# Patient Record
Sex: Female | Born: 1935 | Race: White | Hispanic: No | Marital: Married | Smoking: Never smoker
Health system: Southern US, Community
[De-identification: ages and names within clinical notes are randomized; demographics above are authoritative.]

## PROBLEM LIST (undated history)

## (undated) DIAGNOSIS — I10 Essential (primary) hypertension: Secondary | ICD-10-CM

## (undated) DIAGNOSIS — I2510 Atherosclerotic heart disease of native coronary artery without angina pectoris: Secondary | ICD-10-CM

## (undated) DIAGNOSIS — I509 Heart failure, unspecified: Secondary | ICD-10-CM

## (undated) DIAGNOSIS — I739 Peripheral vascular disease, unspecified: Secondary | ICD-10-CM

## (undated) DIAGNOSIS — E785 Hyperlipidemia, unspecified: Secondary | ICD-10-CM

## (undated) DIAGNOSIS — M810 Age-related osteoporosis without current pathological fracture: Secondary | ICD-10-CM

## (undated) DIAGNOSIS — I4891 Unspecified atrial fibrillation: Secondary | ICD-10-CM

## (undated) HISTORY — PX: CHOLECYSTECTOMY: SHX55

## (undated) HISTORY — PX: CORONARY ANGIOPLASTY WITH STENT PLACEMENT: SHX49

---

## 2015-08-03 ENCOUNTER — Emergency Department (HOSPITAL_COMMUNITY): Payer: Medicare Other

## 2015-08-03 ENCOUNTER — Emergency Department (HOSPITAL_COMMUNITY)
Admission: EM | Admit: 2015-08-03 | Discharge: 2015-08-03 | Disposition: A | Discharge status: Home / Self Care | Payer: Medicare Other | Attending: Emergency Medicine | Admitting: Emergency Medicine

## 2015-08-03 ENCOUNTER — Encounter (HOSPITAL_COMMUNITY): Payer: Self-pay | Admitting: Emergency Medicine

## 2015-08-03 DIAGNOSIS — Z9861 Coronary angioplasty status: Secondary | ICD-10-CM | POA: Diagnosis not present

## 2015-08-03 DIAGNOSIS — Y9289 Other specified places as the place of occurrence of the external cause: Secondary | ICD-10-CM | POA: Insufficient documentation

## 2015-08-03 DIAGNOSIS — Z7982 Long term (current) use of aspirin: Secondary | ICD-10-CM | POA: Insufficient documentation

## 2015-08-03 DIAGNOSIS — I2510 Atherosclerotic heart disease of native coronary artery without angina pectoris: Secondary | ICD-10-CM | POA: Diagnosis not present

## 2015-08-03 DIAGNOSIS — I509 Heart failure, unspecified: Secondary | ICD-10-CM | POA: Diagnosis not present

## 2015-08-03 DIAGNOSIS — W01118A Fall on same level from slipping, tripping and stumbling with subsequent striking against other sharp object, initial encounter: Secondary | ICD-10-CM | POA: Insufficient documentation

## 2015-08-03 DIAGNOSIS — Z91040 Latex allergy status: Secondary | ICD-10-CM | POA: Insufficient documentation

## 2015-08-03 DIAGNOSIS — S0003XA Contusion of scalp, initial encounter: Secondary | ICD-10-CM

## 2015-08-03 DIAGNOSIS — E785 Hyperlipidemia, unspecified: Secondary | ICD-10-CM | POA: Insufficient documentation

## 2015-08-03 DIAGNOSIS — W01119A Fall on same level from slipping, tripping and stumbling with subsequent striking against unspecified sharp object, initial encounter: Secondary | ICD-10-CM

## 2015-08-03 DIAGNOSIS — Y9389 Activity, other specified: Secondary | ICD-10-CM | POA: Insufficient documentation

## 2015-08-03 DIAGNOSIS — Y998 Other external cause status: Secondary | ICD-10-CM | POA: Insufficient documentation

## 2015-08-03 DIAGNOSIS — Z7902 Long term (current) use of antithrombotics/antiplatelets: Secondary | ICD-10-CM | POA: Diagnosis not present

## 2015-08-03 DIAGNOSIS — I10 Essential (primary) hypertension: Secondary | ICD-10-CM | POA: Insufficient documentation

## 2015-08-03 DIAGNOSIS — M810 Age-related osteoporosis without current pathological fracture: Secondary | ICD-10-CM | POA: Insufficient documentation

## 2015-08-03 DIAGNOSIS — I4891 Unspecified atrial fibrillation: Secondary | ICD-10-CM | POA: Insufficient documentation

## 2015-08-03 DIAGNOSIS — S0990XA Unspecified injury of head, initial encounter: Secondary | ICD-10-CM | POA: Diagnosis present

## 2015-08-03 HISTORY — DX: Heart failure, unspecified: I50.9

## 2015-08-03 HISTORY — DX: Essential (primary) hypertension: I10

## 2015-08-03 HISTORY — DX: Age-related osteoporosis without current pathological fracture: M81.0

## 2015-08-03 HISTORY — DX: Peripheral vascular disease, unspecified: I73.9

## 2015-08-03 HISTORY — DX: Hyperlipidemia, unspecified: E78.5

## 2015-08-03 HISTORY — DX: Unspecified atrial fibrillation: I48.91

## 2015-08-03 HISTORY — DX: Atherosclerotic heart disease of native coronary artery without angina pectoris: I25.10

## 2015-08-03 MED ORDER — HYDROCODONE-ACETAMINOPHEN 5-325 MG PO TABS: 1.0000 | ORAL_TABLET | ORAL | Status: AC | PRN

## 2015-08-03 MED ORDER — HYDROCODONE-ACETAMINOPHEN 5-325 MG PO TABS
1.0000 | ORAL_TABLET | Freq: Once | ORAL | Status: DC
  Filled 2015-08-03: qty 1

## 2015-08-03 MED ORDER — ACETAMINOPHEN 325 MG PO TABS
650.0000 mg | ORAL_TABLET | Freq: Once | ORAL | Status: AC
  Administered 2015-08-03: 650 mg via ORAL
  Filled 2015-08-03: qty 2

## 2015-08-03 NOTE — ED Notes (Signed)
Patient transported to CT 

## 2015-08-03 NOTE — Discharge Instructions (Signed)
Facial or Scalp Contusion A facial or scalp contusion is a deep bruise on the face or head. Injuries to the face and head generally cause a lot of swelling, especially around the eyes. Contusions are the result of an injury that caused bleeding under the skin. The contusion may turn blue, purple, or yellow. Minor injuries will give you a painless contusion, but more severe contusions may stay painful and swollen for a few weeks.  CAUSES  A facial or scalp contusion is caused by a blunt injury or trauma to the face or head area.  SIGNS AND SYMPTOMS   Swelling of the injured area.   Discoloration of the injured area.   Tenderness, soreness, or pain in the injured area.  DIAGNOSIS  The diagnosis can be made by taking a medical history and doing a physical exam. An X-ray exam, CT scan, or MRI may be needed to determine if there are any associated injuries, such as broken bones (fractures). TREATMENT  Often, the best treatment for a facial or scalp contusion is applying cold compresses to the injured area. Over-the-counter medicines may also be recommended for pain control.  HOME CARE INSTRUCTIONS   Only take over-the-counter or prescription medicines as directed by your health care provider.   Apply ice to the injured area.   Put ice in a plastic bag.   Place a towel between your skin and the bag.   Leave the ice on for 20 minutes, 2-3 times a day.  SEEK MEDICAL CARE IF:  You have bite problems.   You have pain with chewing.   You are concerned about facial defects. SEEK IMMEDIATE MEDICAL CARE IF:  You have severe pain or a headache that is not relieved by medicine.   You have unusual sleepiness, confusion, or personality changes.   You throw up (vomit).   You have a persistent nosebleed.   You have double vision or blurred vision.   You have fluid drainage from your nose or ear.   You have difficulty walking or using your arms or legs.  MAKE SURE YOU:    Understand these instructions.  Will watch your condition.  Will get help right away if you are not doing well or get worse.      Please follow up with your PCP when you get home  Continue taking your Plavix as this medication is important for your heart  Apply ice for swelling as needed

## 2015-08-03 NOTE — ED Notes (Signed)
Pt statesshe understands instructions. 

## 2015-08-03 NOTE — ED Notes (Signed)
Pt ambulates in hallway with minimal assist. 

## 2015-08-03 NOTE — ED Notes (Signed)
Norco held after pt had lower BP.

## 2015-08-03 NOTE — ED Notes (Signed)
Pt tripped and fell at 8:15 this morning and hit her head on the table. Pt denies LOC. Pt advises she takes plavix. Pt has large hematoma above R eye. Pt has ice pack applied.

## 2015-08-03 NOTE — ED Provider Notes (Signed)
CSN: [id number]     Arrival date & time 08/03/15  [id number] History   First MD Initiated Contact with Patient 08/03/15 1050     Chief Complaint  Patient presents with  . Fall    HPI Comments: 82 year old female who presents with a head injury after a mechanical fall several hours ago. Her and her husband are from from [place] and have been visiting their son. She states she was walking around the house when she tripped and hit her right forehead on the angle of a side table. Pain is currently 6/10. She is currently on Plavix and has taken it today. She normally walks without assistance. Denies LOC, dizziness, chest pain, SOB, abdominal pain, nausea, vomiting, seizure, other acute injury.   The history is provided by the patient.    Past Medical History  Diagnosis Date  . Hyperlipidemia   . CHF (congestive heart failure) (HCC)   . Atrial fibrillation (HCC)   . Osteoporosis   . Peripheral artery disease (HCC)   . Coronary artery disease   . Hypertension    Past Surgical History  Procedure Laterality Date  . Coronary angioplasty with stent placement    . Cholecystectomy     No family history on file. Social History  Substance Use Topics  . Smoking status: Never Smoker   . Smokeless tobacco: None  . Alcohol Use: No   OB History    No data available     Review of Systems  Respiratory: Negative for shortness of breath.   Cardiovascular: Negative for chest pain.  Gastrointestinal: Negative for abdominal pain.  Musculoskeletal: Negative for gait problem.  Skin: Positive for wound.  Neurological: Positive for headaches. Negative for dizziness, tremors, seizures, syncope, speech difficulty, weakness, light-headedness and numbness.    Allergies  Latex  Home Medications   Prior to Admission medications   Medication Sig Start Date End Date Taking? Authorizing Provider  aspirin 325 MG tablet Take 325 mg by mouth daily.   Yes Historical Provider, MD  Calcium Carbonate-Vitamin D  (CALCIUM 500+D HIGH POTENCY PO) Take 1 tablet by mouth daily.   Yes Historical Provider, MD  clopidogrel (PLAVIX) 75 MG tablet Take 75 mg by mouth daily.   Yes Historical Provider, MD  escitalopram (LEXAPRO) 10 MG tablet Take 10 mg by mouth daily.   Yes Historical Provider, MD  furosemide (LASIX) 20 MG tablet Take 20 mg by mouth daily.   Yes Historical Provider, MD  levothyroxine (SYNTHROID, LEVOTHROID) 50 MCG tablet Take 50 mcg by mouth daily before breakfast.   Yes Historical Provider, MD  metoprolol succinate (TOPROL-XL) 25 MG 24 hr tablet Take 25 mg by mouth daily.   Yes Historical Provider, MD  nitroGLYCERIN (NITROSTAT) 0.4 MG SL tablet Place 0.4 mg under the tongue every 5 (five) minutes as needed for chest pain.   Yes Historical Provider, MD  Omega-3 Fatty Acids (FISH OIL) 1000 MG CAPS Take 1,000 mg by mouth 2 (two) times daily.   Yes Historical Provider, MD  ranitidine (ZANTAC) 75 MG tablet Take 75 mg by mouth daily as needed for heartburn.   Yes Historical Provider, MD  rosuvastatin (CRESTOR) 40 MG tablet Take 40 mg by mouth daily.   Yes Historical Provider, MD  thiamine (VITAMIN B-1) 100 MG tablet Take 100 mg by mouth daily.   Yes Historical Provider, MD  vitamin C (ASCORBIC ACID) 500 MG tablet Take 500 mg by mouth daily.   Yes Historical Provider, MD  vitamin E 400  UNIT capsule Take 400 Units by mouth daily.   Yes Historical Provider, MD   BP 108/54 mmHg  Pulse 56  Temp(Src) 97.5 F (36.4 C) (Oral)  Resp 16  Ht 4' 10.5" (1.486 m)  Wt 90.719 kg  BMI 41.08 kg/m2  SpO2 96%   Physical Exam  Constitutional: She is oriented to person, place, and time. She appears well-developed and well-nourished. No distress.  HENT:  Head: Normocephalic. Head is without raccoon's eyes and without Battle's sign.  Right Ear: No hemotympanum.  Left Ear: No hemotympanum.  Nose: No nasal deformity or nasal septal hematoma. No epistaxis.  Hematoma over right forehead. No open wound.  Eyes:  Conjunctivae are normal. Pupils are equal, round, and reactive to light. Right eye exhibits no discharge. Left eye exhibits no discharge. No scleral icterus.  Neck: Normal range of motion. No spinous process tenderness present. No tracheal deviation present.  Cardiovascular: Normal rate, regular rhythm and intact distal pulses.  Exam reveals no gallop and no friction rub.   No murmur heard. Pulmonary/Chest: Effort normal and breath sounds normal. No respiratory distress. She has no wheezes. She has no rales. She exhibits no tenderness.  Abdominal: Soft. There is no tenderness.  Musculoskeletal:  Back - No point tenderness, step offs, deformity Pelvis - No pain or deformity Upper Extremities - No deformities, swelling, bleeding, discoloration, obvious fractures.  Lower Extremities - No deformities, swelling, bleeding, discoloration, obvious fractures.   Neurological: She is alert and oriented to person, place, and time.  Mental Status:  Alert, oriented, thought content appropriate, able to give a coherent history. Speech fluent without evidence of aphasia. Able to follow 2 step commands without difficulty.  Cranial Nerves:  II:  Peripheral visual fields grossly normal, pupils equal, round, reactive to light III,IV, VI: ptosis not present, extra-ocular motions intact bilaterally  V,VII: smile symmetric, facial light touch sensation equal VIII: hearing grossly normal to voice  X: uvula elevates symmetrically  XI: bilateral shoulder shrug symmetric and strong XII: midline tongue extension without fassiculations Motor:  Normal tone. 5/5 in upper and lower extremities bilaterally including strong and equal grip strength and dorsiflexion/plantar flexion Sensory: Pinprick and light touch normal in all extremities.  Cerebellar: normal finger-to-nose with bilateral upper extremities     Skin: Skin is warm and dry.  Psychiatric: She has a normal mood and affect.    ED Course  Procedures  (including critical care time) Labs Review Labs Reviewed - No data to display  Imaging Review Ct Head Wo Contrast  08/03/2015  CLINICAL DATA:  Tripped and fell, hit head on table. Large hematoma above right eye. On blood thinners. EXAM: CT HEAD WITHOUT CONTRAST TECHNIQUE: Contiguous axial images were obtained from the base of the skull through the vertex without intravenous contrast. COMPARISON:  None. FINDINGS: Large soft tissue hematoma overlying the right frontal region. No underlying bony abnormality. Mild cerebral atrophy. No acute intracranial abnormality. Specifically, no hemorrhage, hydrocephalus, mass lesion, acute infarction, or significant intracranial injury. No acute calvarial abnormality. Visualized paranasal sinuses and mastoids clear. Orbital soft tissues unremarkable. IMPRESSION: Right frontal scalp hematoma.  No acute intracranial abnormality. Electronically Signed   By: Charlett Nose M.D.   On: 08/03/2015 11:43   Ct Cervical Spine Wo Contrast  08/03/2015  CLINICAL DATA:  Tripped and fell, hit head on table. EXAM: CT CERVICAL SPINE WITHOUT CONTRAST TECHNIQUE: Multidetector CT imaging of the cervical spine was performed without intravenous contrast. Multiplanar CT image reconstructions were also generated. COMPARISON:  None. FINDINGS:  Diffuse degenerative disc disease and mild degenerative facet disease bilaterally. Normal alignment. No fracture. Prevertebral soft tissues are normal. Tortuosity of the carotid arteries which are midline. Calcifications in the carotid bulbs bilaterally. IMPRESSION: Degenerative changes in the cervical spine. No acute bony abnormality. Electronically Signed   By: Charlett Nose M.D.   On: 08/03/2015 13:04   I have personally reviewed and evaluated these images and lab results as part of my medical decision-making.   EKG Interpretation None      MDM   Final diagnoses:  Fall against sharp object, initial encounter  Scalp hematoma, initial encounter    82 year old female who presents with a head injury s/p mechanical fall.  She denies LOC, severe HA, vomiting, seizures. She is on Plavix. Advised to not skip a dose as this medicine is important for her heart. Her PE was reassuring in that there were no neuro deficits, signs of skull fracture (Battle sign, Raccoon eyes, nasal CSF leak), no hemotympanum, or septal hematoma. Secondary survey revealed no distracting injury.   CT of head showed large hematoma however no bony abnormality or evidence of brain bleed. CT of neck was negative for acute fracture.   She was able to walk in the hall with minimal assistance. Her BP was on the lower side here however when checked manually it was 100s/60s which is her baseline. Recommended Tylenol or Norco for pain since she is on Plavix. Patient advised to follow up with her PCP. Also advised to delay her trip back to [place] for a couple days to see how she feels. Patient and family informed of clinical course, understand medical decision-making process, and agree with plan.      Mary Born, PA-C 08/04/15 1156  Arby Barrette, MD 08/20/15 0730

## 2015-08-03 NOTE — ED Notes (Signed)
Pt wheeled back to room from waiting room. 

## 2017-10-11 IMAGING — CT CT HEAD W/O CM
2 series · 15 of 30 positions shown, 17 images · non-contrast
Comparison: None.

CLINICAL DATA: Tripped and fell, hit head on table. Large hematoma
above right eye. On blood thinners.

EXAM:
CT HEAD WITHOUT CONTRAST
TECHNIQUE: Contiguous axial images were obtained from the base of the skull
through the vertex without intravenous contrast.

[Series 2: head without · axial · non-contrast · 0.41mm/px · z∈[-210,-95]mm · 7 of 31 slices shown, 9 images]
[im 4/31  brain]
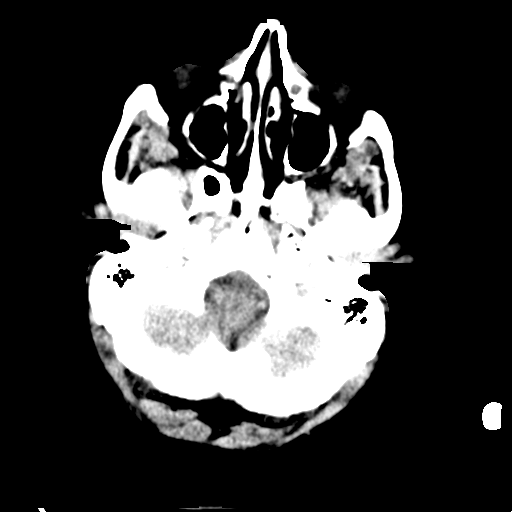
[im 4/31  bone]
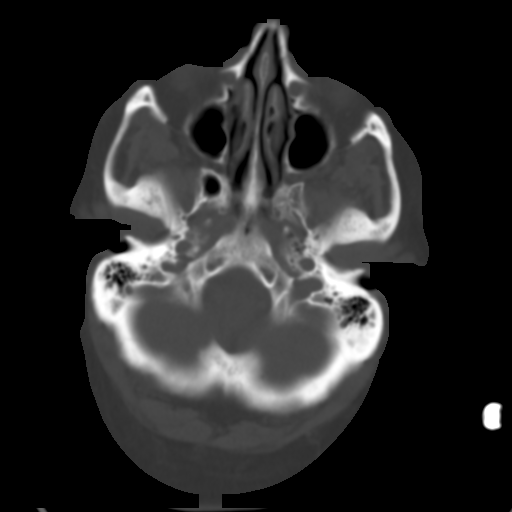
[im 8/31  brain]
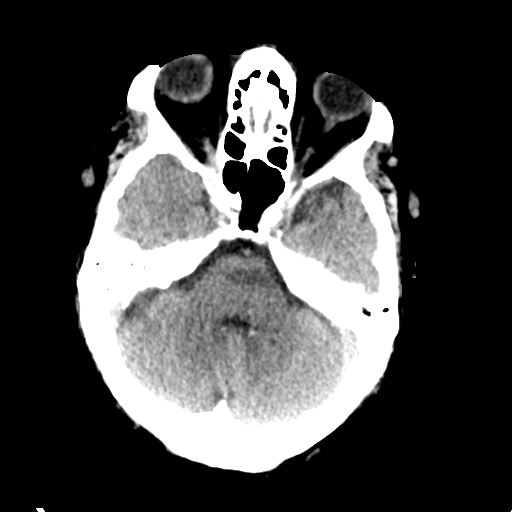
[im 12/31  brain]
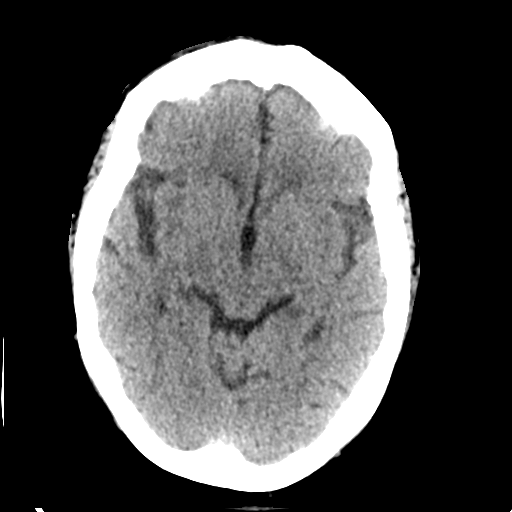
[im 16/31  brain]
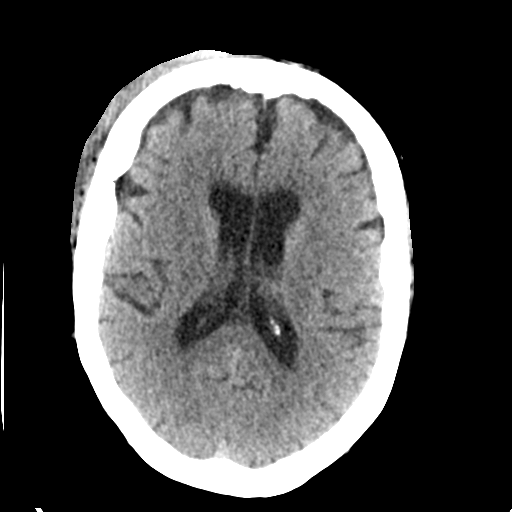
[im 19/31  brain]
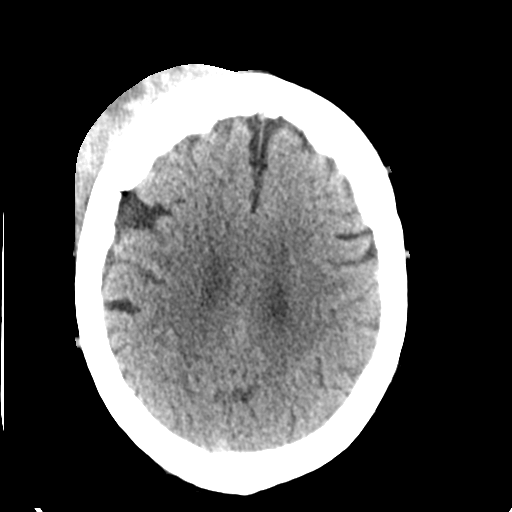
[im 19/31  bone]
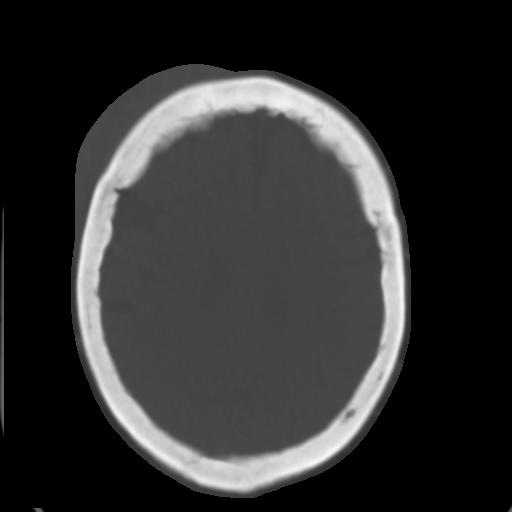
[im 23/31  brain]
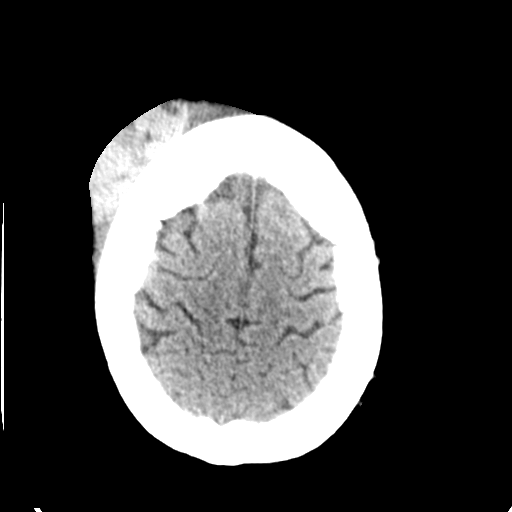
[im 27/31  brain]
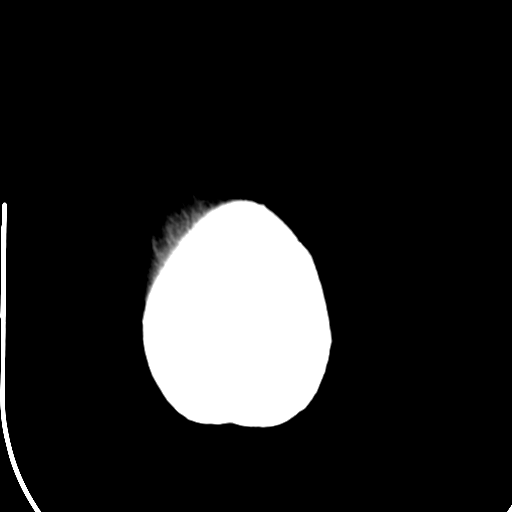

[Series 3: head bone · axial · 0.41mm/px · z∈[-211,-91]mm · 8 of 76 slices shown]
[im 8/76  bone]
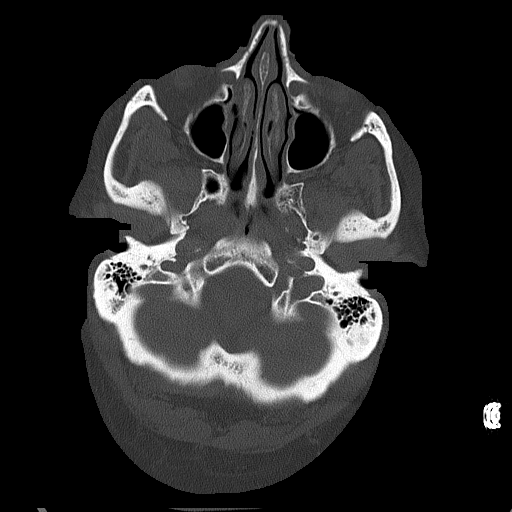
[im 16/76  bone]
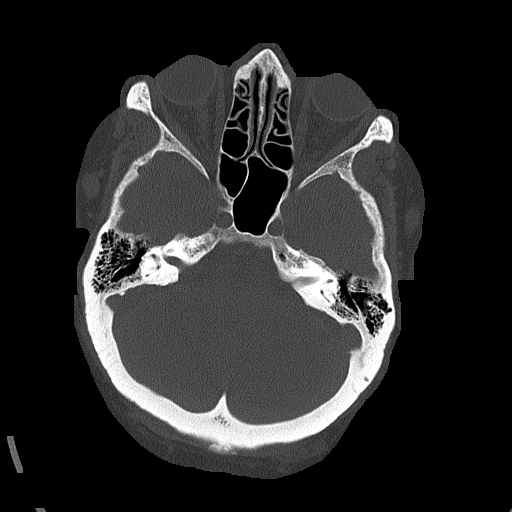
[im 23/76  bone]
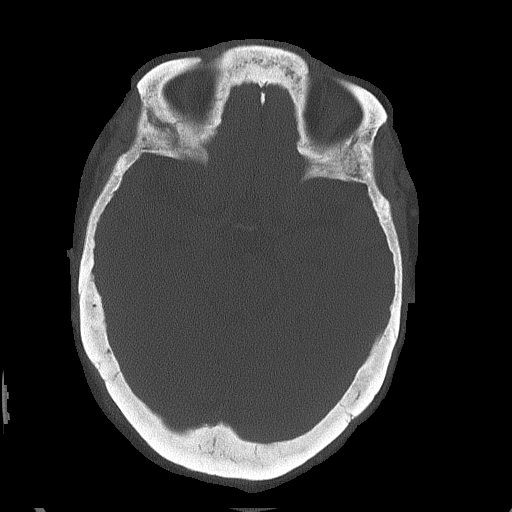
[im 34/76  bone]
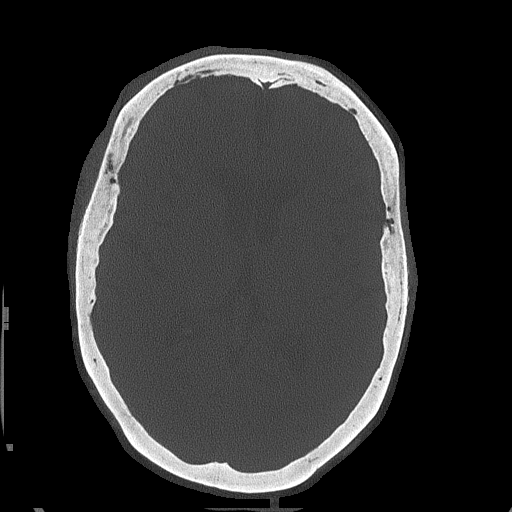
[im 42/76  bone]
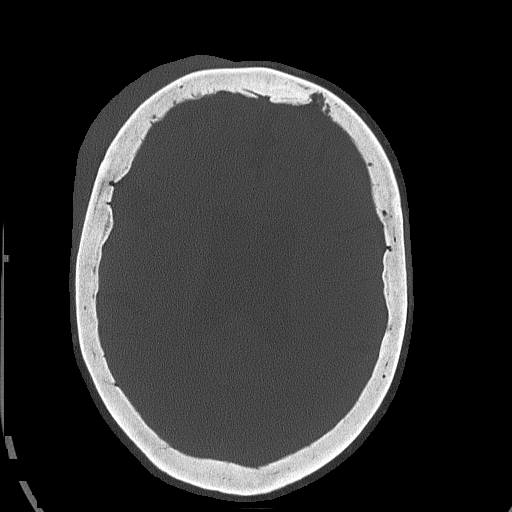
[im 53/76  bone]
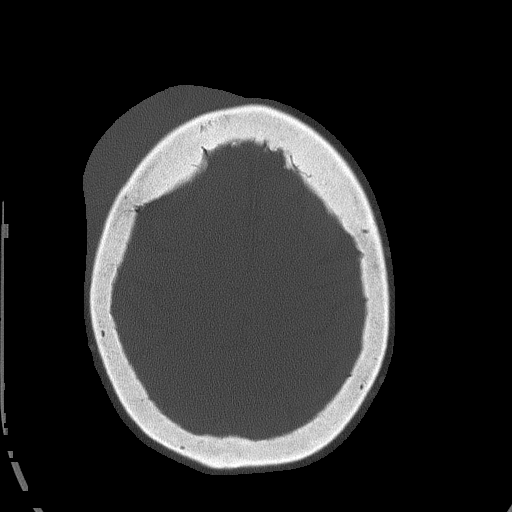
[im 61/76  bone]
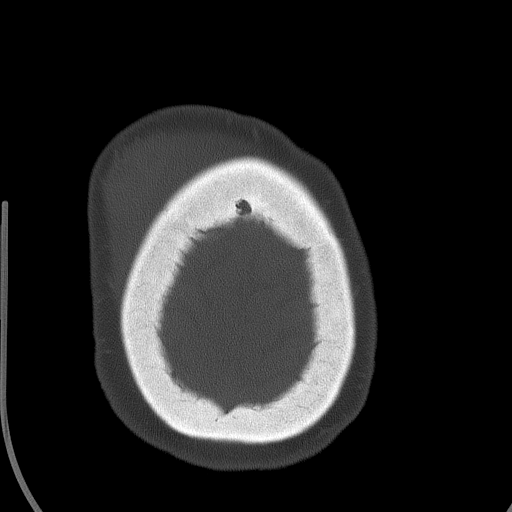
[im 68/76  bone]
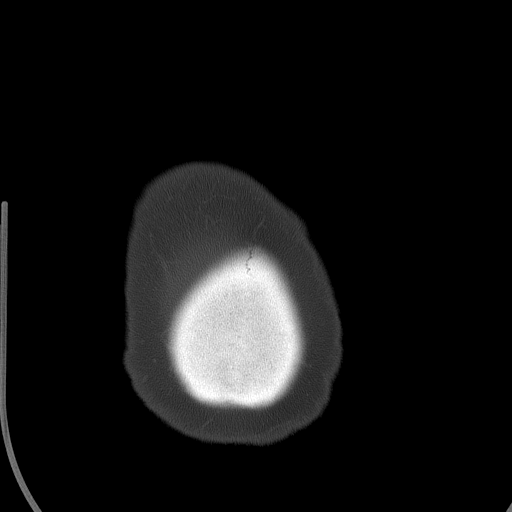

[15 of 30 positions shown; findings below may reference images not displayed]

FINDINGS: Large soft tissue hematoma overlying the right frontal region. No
underlying bony abnormality. Mild cerebral atrophy. No acute
intracranial abnormality. Specifically, no hemorrhage,
hydrocephalus, mass lesion, acute infarction, or significant
intracranial injury. No acute calvarial abnormality. Visualized
paranasal sinuses and mastoids clear. Orbital soft tissues
unremarkable.
IMPRESSION: Right frontal scalp hematoma.  No acute intracranial abnormality.

## 2017-10-11 IMAGING — CT CT CERVICAL SPINE W/O CM
3 of 4 series · 13 of 33 positions shown, 16 images · non-contrast
Comparison: None.

CLINICAL DATA: Tripped and fell, hit head on table.

EXAM:
CT CERVICAL SPINE WITHOUT CONTRAST
TECHNIQUE: Multidetector CT imaging of the cervical spine was performed without
intravenous contrast. Multiplanar CT image reconstructions were also
generated.

[Series 3: c_spine 2.0 st · axial · 0.27mm/px · z∈[-313,-203]mm · 5 of 83 slices shown, 7 images]
[im 14/83  soft-tissue]
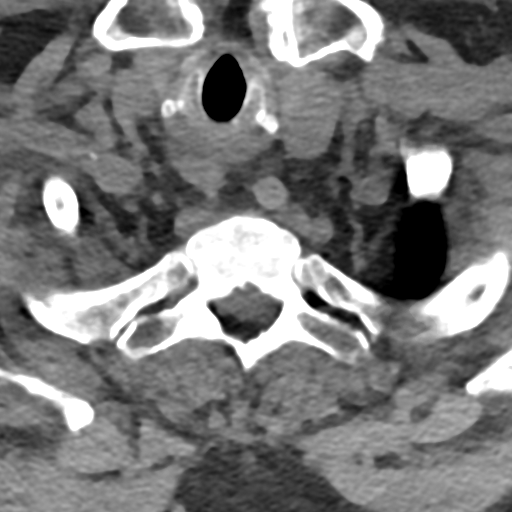
[im 14/83  bone]
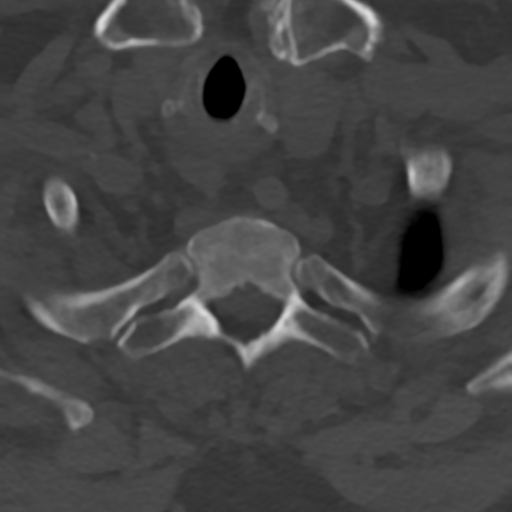
[im 28/83  bone]
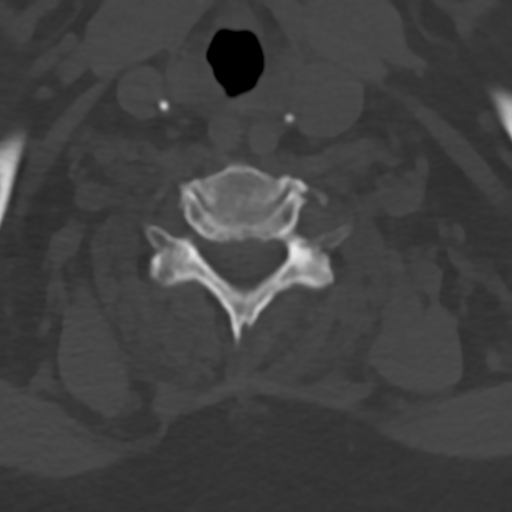
[im 42/83  bone]
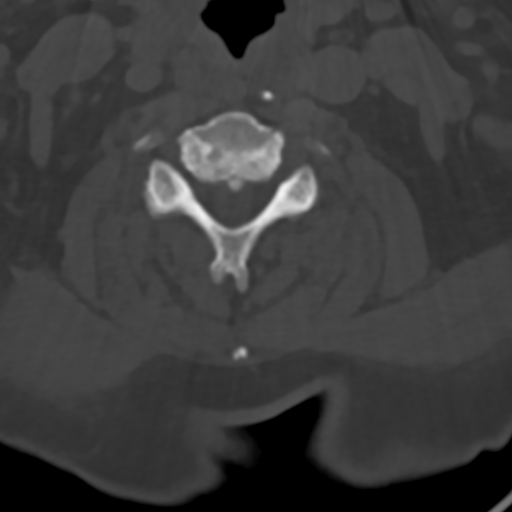
[im 55/83  bone]
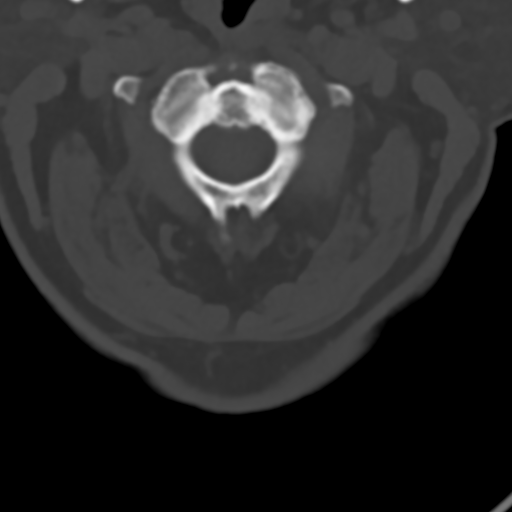
[im 69/83  soft-tissue]
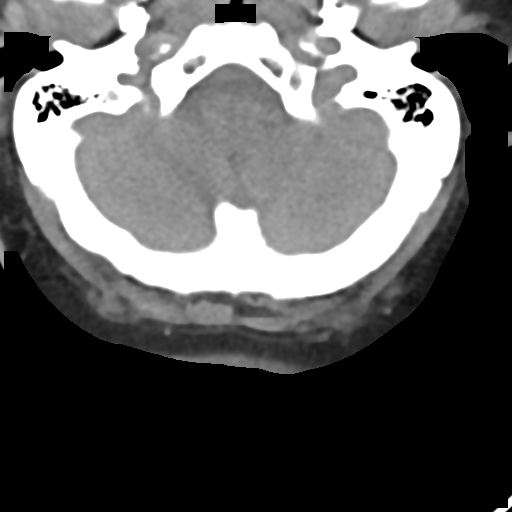
[im 69/83  bone]
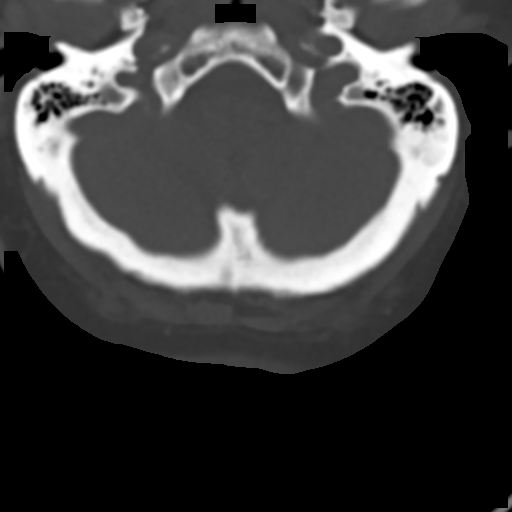

[Series 5: c_spine 2.0 sag bone · sagittal · 0.24mm/px · 5 of 52 slices shown, 6 images]
[im 18/52  bone]
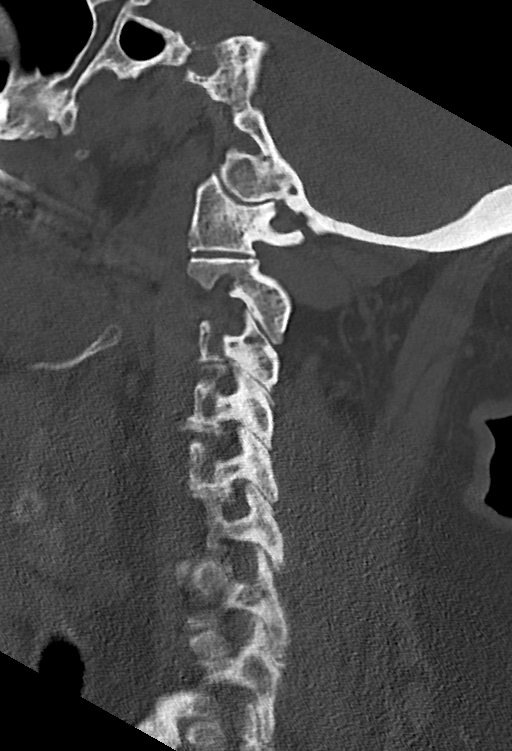
[im 22/52  bone]
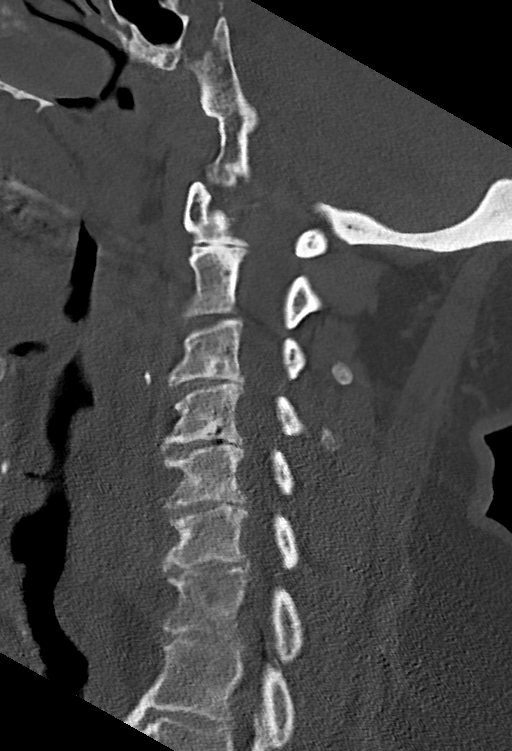
[im 26/52  soft-tissue]
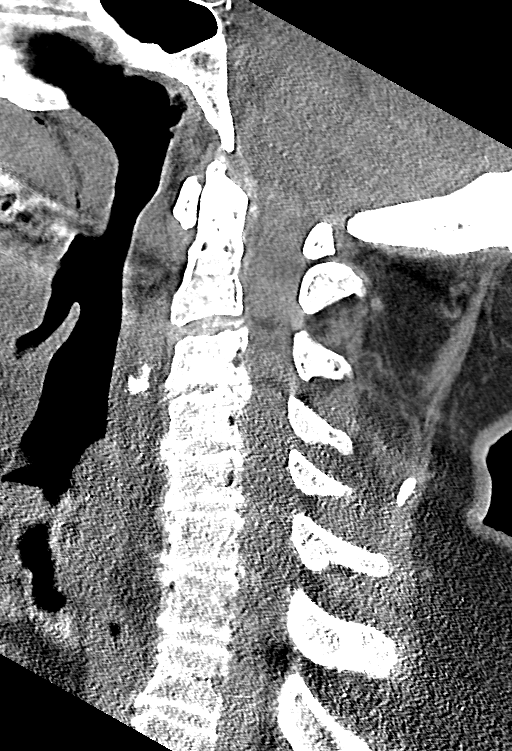
[im 26/52  bone]
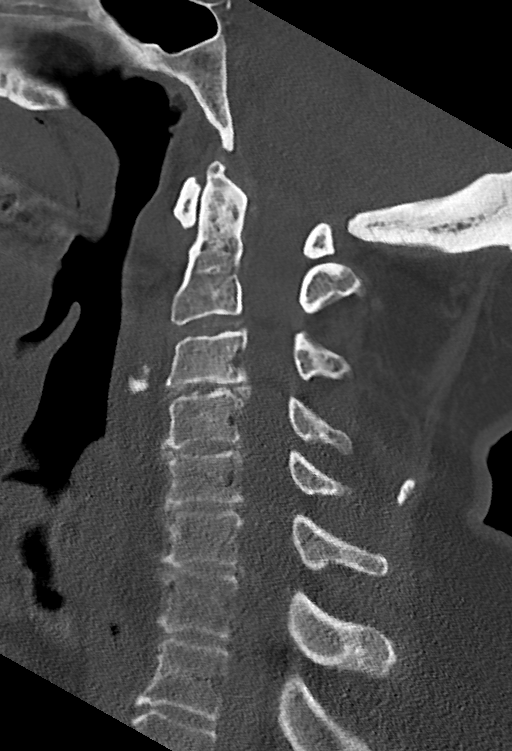
[im 30/52  bone]
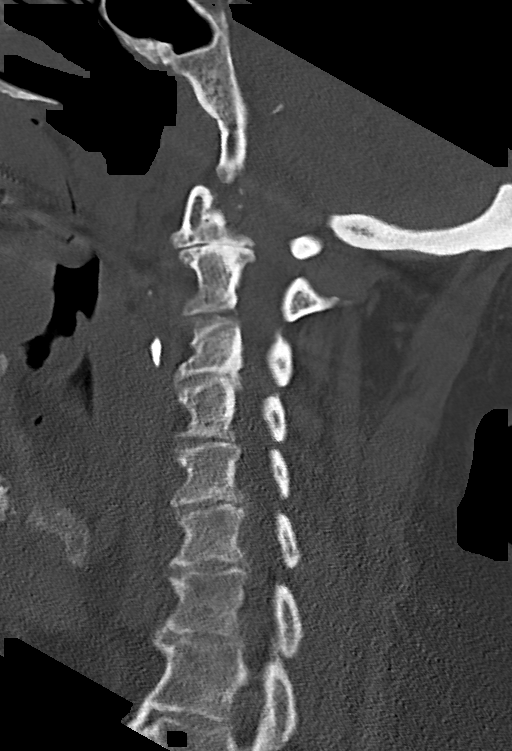
[im 35/52  bone]
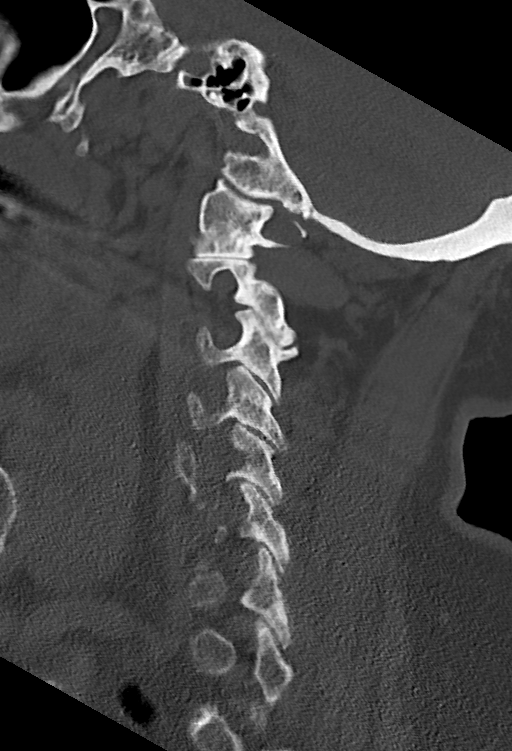

[Series 6: c_spine 2.0 cor bone · coronal · 0.26mm/px · 3 of 45 slices shown]
[im 9/45  bone]
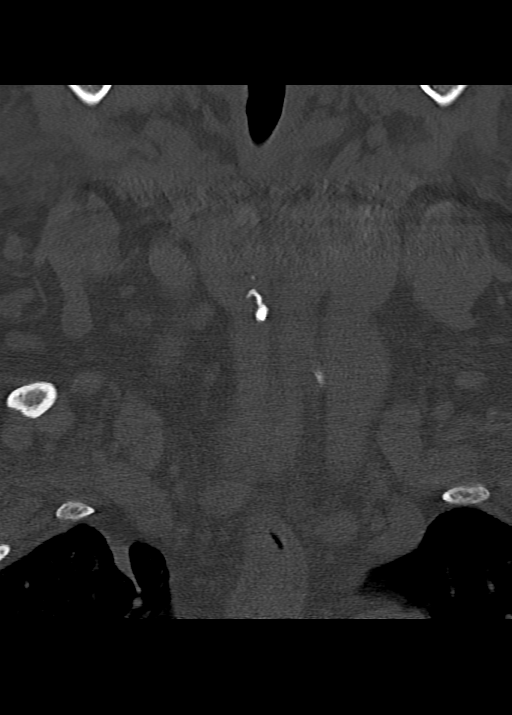
[im 18/45  bone]
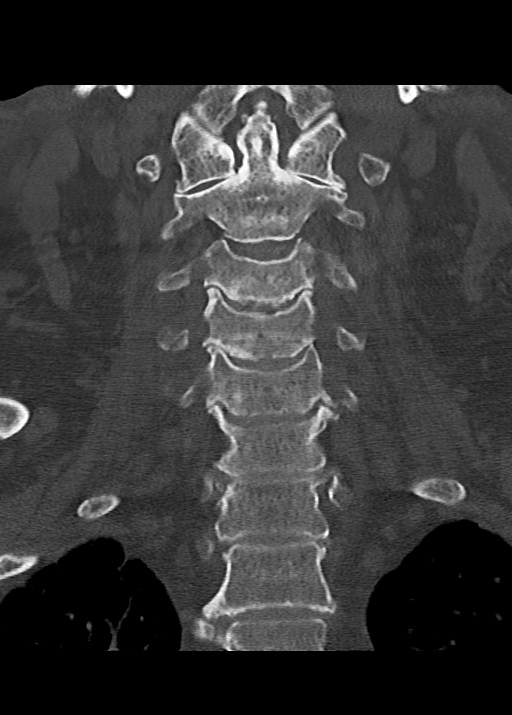
[im 27/45  bone]
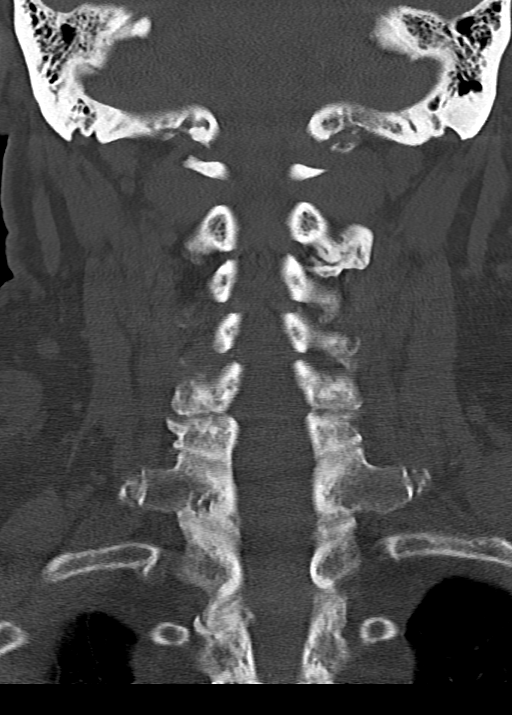

[13 of 33 positions shown; findings below may reference images not displayed]

FINDINGS: Diffuse degenerative disc disease and mild degenerative facet
disease bilaterally. Normal alignment. No fracture. Prevertebral
soft tissues are normal. Tortuosity of the carotid arteries which
are midline. Calcifications in the carotid bulbs bilaterally.
IMPRESSION: Degenerative changes in the cervical spine. No acute bony
abnormality.
